# Patient Record
Sex: Female | Born: 1970 | Race: White | Hispanic: No | Marital: Married | Smoking: Never smoker
Health system: Southern US, Community
[De-identification: ages and names within clinical notes are randomized; demographics above are authoritative.]

## PROBLEM LIST (undated history)

## (undated) DIAGNOSIS — R42 Dizziness and giddiness: Secondary | ICD-10-CM

---

## 2004-07-14 ENCOUNTER — Inpatient Hospital Stay: Payer: Self-pay

## 2009-01-14 ENCOUNTER — Emergency Department: Payer: Self-pay | Admitting: Emergency Medicine

## 2009-04-06 ENCOUNTER — Other Ambulatory Visit: Payer: Self-pay

## 2009-07-15 ENCOUNTER — Ambulatory Visit: Payer: Self-pay

## 2011-08-24 ENCOUNTER — Ambulatory Visit: Payer: Self-pay | Admitting: Family Medicine

## 2012-09-26 ENCOUNTER — Ambulatory Visit: Payer: Self-pay | Admitting: Family Medicine

## 2013-10-29 ENCOUNTER — Ambulatory Visit: Payer: Self-pay | Admitting: Family Medicine

## 2014-06-04 ENCOUNTER — Encounter: Payer: Self-pay | Admitting: *Deleted

## 2014-08-05 ENCOUNTER — Ambulatory Visit (INDEPENDENT_AMBULATORY_CARE_PROVIDER_SITE_OTHER): Payer: BC Managed Care – PPO | Admitting: Surgery

## 2014-08-05 ENCOUNTER — Encounter: Payer: Self-pay | Admitting: Surgery

## 2014-08-05 VITALS — BP 127/72 | HR 74 | Temp 98.7°F | Resp 20 | Ht 66.0 in | Wt 143.0 lb

## 2014-08-05 DIAGNOSIS — R229 Localized swelling, mass and lump, unspecified: Secondary | ICD-10-CM | POA: Insufficient documentation

## 2014-08-05 NOTE — Progress Notes (Signed)
44 yo F who presents from dermatology clinic for blue tinged nodule on "perineum".  Was noticed on exam.  Min pain.  Some swelling.  No redness/drainage.  Had dysplastic lesion removed from left leg.  No fevers/chills, night sweats, shortness of breath, cough, chest pain, abdominal pain, nausea/vomiting, diarrhea/constipation.  No dysuria/hematuria.  Active Ambulatory Problems    Diagnosis Date Noted  . Skin nodule 08/05/2014   Resolved Ambulatory Problems    Diagnosis Date Noted  . No Resolved Ambulatory Problems   No Additional Past Medical History   Allergies  Allergen Reactions  . Sulfa Antibiotics Rash   History   Social History  . Marital Status: Married    Spouse Name: N/A  . Number of Children: N/A  . Years of Education: N/A   Occupational History  . Not on file.   Social History Main Topics  . Smoking status: Never Smoker   . Smokeless tobacco: Never Used  . Alcohol Use: No  . Drug Use: No  . Sexual Activity: Yes   Other Topics Concern  . Not on file   Social History Narrative  . No narrative on file   ROS: Full ROS obtained, pertinent positives and negatives as below.  Blood pressure 127/72, pulse 74, temperature 98.7 F (37.1 C), temperature source Oral, resp. rate 20, height 5\' 6"  (1.676 m), weight 64.864 kg (143 lb), last menstrual period 07/27/2014. GEN: NAD/A&Ox3 FACE: no obvious facial trauma, normal external nose, normal external ears EYES: no scleral icterus, no conjunctivitis HEAD: normocephalic atraumatic CV: RRR, no MRG RESP: moving air well, lungs clear ABD: soft, nontender, nondistended Perineum: Approx 1 x 1 cm nodule at inferior aspect of right labia, nontender, nonfluctuant, no erythema/induration, blue tinged EXT: moving all ext well, strength 5/5, right medial thigh with small ulcer with mild surrounding erythema NEURO: cnII-XII grossly intact, sensation intact all 4 ext  Imaging: none Labs: none  A?P 44 yo with right  perilabial/perineal nodule.  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 Is having appt soon with GYN for annual checkup, will defer to them and if they deem necessary I can see her again for discussion of excision.  She has been told to call me if she has increased pain, swelling, redness/drainage from lesion.  She is happy with this plan.  Right medial thigh wound I have given polysporin and nonstick gauze to facilitate healing.

## 2014-08-05 NOTE — Patient Instructions (Signed)
F/u with GYN Call if lesion becomes painful, swollen, red or begins draining

## 2015-09-23 ENCOUNTER — Encounter: Payer: Self-pay | Admitting: Emergency Medicine

## 2015-09-23 ENCOUNTER — Emergency Department
Admission: EM | Admit: 2015-09-23 | Discharge: 2015-09-24 | Disposition: A | Discharge status: Home / Self Care | Payer: BC Managed Care – PPO | Attending: Emergency Medicine | Admitting: Emergency Medicine

## 2015-09-23 DIAGNOSIS — R112 Nausea with vomiting, unspecified: Secondary | ICD-10-CM | POA: Diagnosis not present

## 2015-09-23 DIAGNOSIS — Z79899 Other long term (current) drug therapy: Secondary | ICD-10-CM | POA: Insufficient documentation

## 2015-09-23 DIAGNOSIS — R42 Dizziness and giddiness: Secondary | ICD-10-CM | POA: Diagnosis not present

## 2015-09-23 HISTORY — DX: Dizziness and giddiness: R42

## 2015-09-23 LAB — CBC
HEMATOCRIT: 42.2 % (ref 35.0–47.0)
HEMOGLOBIN: 14.3 g/dL (ref 12.0–16.0)
MCH: 30.1 pg (ref 26.0–34.0)
MCHC: 33.8 g/dL (ref 32.0–36.0)
MCV: 89 fL (ref 80.0–100.0)
Platelets: 259 10*3/uL (ref 150–440)
RBC: 4.74 MIL/uL (ref 3.80–5.20)
RDW: 13.4 % (ref 11.5–14.5)
WBC: 13.5 10*3/uL — ABNORMAL HIGH (ref 3.6–11.0)

## 2015-09-23 LAB — BASIC METABOLIC PANEL
ANION GAP: 11 (ref 5–15)
BUN: 12 mg/dL (ref 6–20)
CHLORIDE: 105 mmol/L (ref 101–111)
CO2: 23 mmol/L (ref 22–32)
Calcium: 9.4 mg/dL (ref 8.9–10.3)
Creatinine, Ser: 0.7 mg/dL (ref 0.44–1.00)
GFR calc Af Amer: >60 mL/min (ref >60)
GFR calc non Af Amer: >60 mL/min (ref >60)
GLUCOSE: 123 mg/dL — AB (ref 65–99)
POTASSIUM: 3.7 mmol/L (ref 3.5–5.1)
Sodium: 139 mmol/L (ref 135–145)

## 2015-09-23 LAB — TROPONIN I: Troponin I: <0.03 ng/mL (ref <0.03)

## 2015-09-23 MED ORDER — ONDANSETRON HCL 4 MG/2ML IJ SOLN
4.0000 mg | Freq: Once | INTRAMUSCULAR | Status: AC
  Administered 2015-09-23: 4 mg via INTRAVENOUS
  Filled 2015-09-23: qty 2

## 2015-09-23 MED ORDER — MECLIZINE HCL 25 MG PO TABS
25.0000 mg | ORAL_TABLET | Freq: Once | ORAL | Status: AC
  Administered 2015-09-23: 25 mg via ORAL
  Filled 2015-09-23: qty 1

## 2015-09-23 MED ORDER — ONDANSETRON 4 MG PO TBDP
4.0000 mg | ORAL_TABLET | Freq: Once | ORAL | Status: AC
  Administered 2015-09-23: 4 mg via ORAL

## 2015-09-23 MED ORDER — ONDANSETRON 4 MG PO TBDP
ORAL_TABLET | ORAL | Status: AC
  Filled 2015-09-23: qty 1

## 2015-09-23 MED ORDER — SODIUM CHLORIDE 0.9 % IV BOLUS (SEPSIS)
1000.0000 mL | Freq: Once | INTRAVENOUS | Status: AC
  Administered 2015-09-23: 1000 mL via INTRAVENOUS

## 2015-09-23 NOTE — ED Triage Notes (Signed)
Pt with husband via POV c/o "vertigo".  Pt reports N/V and dizziness since 1600 today and reports migraine HA last on and off since Wednesday.  Pt reports the last "vertigo" was 14 years ago when pregnant.  Pt reports taking meclizine at 2000 but has been vomiting continuously since 1800.

## 2015-09-23 NOTE — ED Provider Notes (Signed)
[facility] Emergency Department Provider Note  ____________________________________________   First MD Initiated Contact with Patient 09/23/15 2336     (approximate)  I have reviewed the triage vital signs and the nursing notes.   HISTORY  Chief Complaint Dizziness    HPI Dorothy Hudson is a 45 y.o. female presents with acute onset of dizziness at approximately 4 PM today. Patient states symptoms consistent with vertigo which she experience approximately 14 years ago. Patient states that episode initially was brief but then reoccurred at 6 PM at which time she took meclizine however this was arrived 14 years ago symptoms did not improve following this administration of meclizine. Patient admits to persistent vomiting since approximately 6 PM. Patient states symptoms are worse with movement of her head. Patient denies any headache no change in vision no weakness no numbness or change in speech  Past Medical History:  Diagnosis Date  . Vertigo     Patient Active Problem List   Diagnosis Date Noted  . Skin nodule 08/05/2014    History reviewed. No pertinent surgical history.  Prior to Admission medications   Medication Sig Start Date End Date Taking? Authorizing Provider  levothyroxine (SYNTHROID, LEVOTHROID) 100 MCG tablet TAKE 1 TABLET (100 MCG) BY ORAL ROUTE ONCE DAILY FOR 90 DAYS 07/15/14   Historical Provider, MD    Allergies Phenergan [promethazine] and Sulfa antibiotics  Family History  Problem Relation Age of Onset  . Alpha-1 antitrypsin deficiency Father   . IgA nephropathy Mother     IGA DEFICIENCY & ANTI IGA ANTIBODY    Social History Social History  Substance Use Topics  . Smoking status: Never Smoker  . Smokeless tobacco: Never Used  . Alcohol use No    Review of Systems Constitutional: No fever/chills Eyes: No visual changes. ENT: No sore throat. Cardiovascular: Denies chest pain. Respiratory: Denies shortness of  breath. Gastrointestinal: No abdominal pain.  No nausea, no vomiting.  No diarrhea.  No constipation. Genitourinary: Negative for dysuria. Musculoskeletal: Negative for back pain. Skin: Negative for rash. Neurological: Negative for headaches, focal weakness or numbness. Positive for dizziness Psychiatric:None Endocrine:None  Hematological/Lymphatic:  10-point ROS otherwise negative.  ____________________________________________   PHYSICAL EXAM:  VITAL SIGNS: ED Triage Vitals [09/23/15 2151]  Enc Vitals Group     BP 125/65     Pulse Rate 80     Resp 20     Temp 97.9 F (36.6 C)     Temp Source Oral     SpO2      Weight 153 lb (69.4 kg)     Height 5' 5.5" (1.664 m)     Head Circumference      Peak Flow      Pain Score      Pain Loc      Pain Edu?      Excl. in GC?    Constitutional: Alert and oriented. Well appearing and in no acute distress. Eyes: Conjunctivae are normal. PERRL. EOMI. Head: Atraumatic. Ears:  Healthy appearing ear canals Clear fluid noted posterior to bilateral tympanic membrane Nose: No congestion/rhinnorhea. Mouth/Throat: Mucous membranes are moist.  Oropharynx non-erythematous. Neck: No stridor.  No meningeal signs.  Cardiovascular: Normal rate, regular rhythm. Good peripheral circulation. Grossly normal heart sounds.   Respiratory: Normal respiratory effort.  No retractions. Lungs CTAB. Gastrointestinal: Soft and nontender. No distention.  Musculoskeletal: No lower extremity tenderness nor edema. No gross deformities of extremities. Neurologic:  Normal speech and language. No gross focal neurologic deficits  are appreciated.  Skin:  Skin is warm, dry and intact. No rash noted. Psychiatric: Mood and affect are normal. Speech and behavior are normal.**}  ____________________________________________   LABS (all labs ordered are listed, but only abnormal results are displayed)  Labs Reviewed  BASIC METABOLIC PANEL - Abnormal; Notable for the  following:       Result Value   Glucose, Bld 123 (*)    All other components within normal limits  CBC - Abnormal; Notable for the following:    WBC 13.5 (*)    All other components within normal limits  TROPONIN I  URINALYSIS COMPLETEWITH MICROSCOPIC ([facility] ONLY)  POC URINE PREG, ED   ____________________________________________  EKG  ED ECG REPORT I, [place] N BROWN, the attending physician, personally viewed and interpreted this ECG.   Date: 09/23/2015  EKG Time: 9:59 PM  Rate: 75  Rhythm: Normal sinus rhythm  Axis: Normal  Intervals: Normal  ST&T Change: None  ____________________________________________  RADIOLOGY  CLINICAL DATA:  Vertigo. Nausea, vomiting and dizziness. Intermittent headache. EXAM: CT HEAD WITHOUT CONTRAST TECHNIQUE: Contiguous axial images were obtained from the base of the skull through the vertex without intravenous contrast. COMPARISON:  None. FINDINGS: Brain: Gray-white differentiation is maintained. No CT evidence of acute large territory infarct. No intraparenchymal or extra-axial mass or hemorrhage. Normal size and configuration of the ventricles and basilar cisterns. No midline shift. Vascular: No hyperdense vessel or unexpected calcification. Skull: No displaced calvarial fracture. Sinuses/Orbits: Limited visualization the paranasal sinuses and mastoid air cells is normal. No air-fluid levels. Other: Regional soft tissues appear normal. IMPRESSION: Negative noncontrast head CT. Electronically Signed   By: Simonne Come M.D.   On: 09/24/2015 01:34  Procedures   ____________________________________________   INITIAL IMPRESSION / ASSESSMENT AND PLAN / ED COURSE  Pertinent labs & imaging results that were available during my care of the patient were reviewed by me and considered in my medical decision making (see chart for details).  Patient given meclizine 25 mg IV normal saline 1 L Zofran and additional 4 mg after  evaluation.Patient states symptoms much improved  Clinical Course    ____________________________________________  FINAL CLINICAL IMPRESSION(S) / ED DIAGNOSES  Final diagnoses:  Vertigo     MEDICATIONS GIVEN DURING THIS VISIT:  Medications  ondansetron (ZOFRAN-ODT) disintegrating tablet 4 mg (4 mg Oral Given 09/23/15 2213)     NEW OUTPATIENT MEDICATIONS STARTED DURING THIS VISIT:  New Prescriptions   No medications on file      Note:  This document was prepared using Dragon voice recognition software and may include unintentional dictation errors.    Darci Current, MD 09/24/15 [phone]

## 2015-09-23 NOTE — ED Notes (Signed)
Dr. Brown at bedside

## 2015-09-24 ENCOUNTER — Emergency Department: Payer: BC Managed Care – PPO

## 2015-09-24 LAB — URINALYSIS COMPLETE WITH MICROSCOPIC (ARMC ONLY)
BACTERIA UA: NONE SEEN
BILIRUBIN URINE: NEGATIVE
GLUCOSE, UA: NEGATIVE mg/dL
HGB URINE DIPSTICK: NEGATIVE
LEUKOCYTES UA: NEGATIVE
NITRITE: NEGATIVE
PH: 6 (ref 5.0–8.0)
Protein, ur: NEGATIVE mg/dL
SPECIFIC GRAVITY, URINE: 1.02 (ref 1.005–1.030)

## 2015-09-24 LAB — POCT PREGNANCY, URINE: Preg Test, Ur: NEGATIVE

## 2015-09-24 MED ORDER — MECLIZINE HCL 25 MG PO TABS
ORAL_TABLET | ORAL | Status: AC
  Filled 2015-09-24: qty 1

## 2015-09-24 MED ORDER — MECLIZINE HCL 32 MG PO TABS: 32.0000 mg | ORAL_TABLET | Freq: Three times a day (TID) | ORAL | 1 refills | Status: AC | PRN

## 2015-09-24 MED ORDER — MECLIZINE HCL 25 MG PO TABS
25.0000 mg | ORAL_TABLET | Freq: Once | ORAL | Status: AC
  Administered 2015-09-24: 25 mg via ORAL

## 2015-09-24 NOTE — ED Notes (Signed)
Pt ambulatory with assistance to toilet.

## 2015-09-24 NOTE — ED Notes (Signed)
Pt states she feels okay as long as she doesn't move. Pt states dizziness and nausea are not any more than before. Pt informed of CT results and plan to discharge.

## 2015-09-24 NOTE — ED Notes (Signed)
Pt returned from CT via stretcher.

## 2015-09-24 NOTE — ED Notes (Signed)
Pt taken to CT via stretcher.

## 2016-02-01 ENCOUNTER — Other Ambulatory Visit: Payer: Self-pay | Admitting: Internal Medicine

## 2016-02-01 DIAGNOSIS — Z1231 Encounter for screening mammogram for malignant neoplasm of breast: Secondary | ICD-10-CM

## 2016-02-11 ENCOUNTER — Ambulatory Visit
Admission: RE | Admit: 2016-02-11 | Discharge: 2016-02-11 | Disposition: A | Discharge status: Home / Self Care | Payer: BC Managed Care – PPO | Source: Ambulatory Visit | Attending: Internal Medicine | Admitting: Internal Medicine

## 2016-02-11 DIAGNOSIS — Z1231 Encounter for screening mammogram for malignant neoplasm of breast: Secondary | ICD-10-CM | POA: Insufficient documentation

## 2016-02-29 ENCOUNTER — Other Ambulatory Visit: Payer: Self-pay | Admitting: Internal Medicine

## 2016-02-29 DIAGNOSIS — R928 Other abnormal and inconclusive findings on diagnostic imaging of breast: Secondary | ICD-10-CM

## 2016-02-29 DIAGNOSIS — N6489 Other specified disorders of breast: Secondary | ICD-10-CM

## 2016-03-01 ENCOUNTER — Ambulatory Visit
Admission: RE | Admit: 2016-03-01 | Discharge: 2016-03-01 | Disposition: A | Discharge status: Home / Self Care | Payer: BC Managed Care – PPO | Source: Ambulatory Visit | Attending: Internal Medicine | Admitting: Internal Medicine

## 2016-03-01 DIAGNOSIS — N6489 Other specified disorders of breast: Secondary | ICD-10-CM

## 2016-03-01 DIAGNOSIS — R928 Other abnormal and inconclusive findings on diagnostic imaging of breast: Secondary | ICD-10-CM

## 2016-03-01 DIAGNOSIS — N651 Disproportion of reconstructed breast: Secondary | ICD-10-CM | POA: Insufficient documentation

## 2016-03-06 ENCOUNTER — Other Ambulatory Visit: Payer: BC Managed Care – PPO

## 2016-03-06 ENCOUNTER — Ambulatory Visit: Payer: BC Managed Care – PPO

## 2017-02-23 ENCOUNTER — Other Ambulatory Visit: Payer: Self-pay | Admitting: Internal Medicine

## 2017-02-23 DIAGNOSIS — Z1231 Encounter for screening mammogram for malignant neoplasm of breast: Secondary | ICD-10-CM

## 2017-03-19 ENCOUNTER — Ambulatory Visit
Admission: RE | Admit: 2017-03-19 | Discharge: 2017-03-19 | Disposition: A | Discharge status: Home / Self Care | Payer: BC Managed Care – PPO | Source: Ambulatory Visit | Attending: Internal Medicine | Admitting: Internal Medicine

## 2017-03-19 DIAGNOSIS — Z1231 Encounter for screening mammogram for malignant neoplasm of breast: Secondary | ICD-10-CM | POA: Insufficient documentation

## 2017-12-13 ENCOUNTER — Other Ambulatory Visit: Payer: Self-pay | Admitting: Otolaryngology

## 2017-12-13 DIAGNOSIS — H6981 Other specified disorders of Eustachian tube, right ear: Secondary | ICD-10-CM

## 2017-12-21 ENCOUNTER — Ambulatory Visit
Admission: RE | Admit: 2017-12-21 | Discharge: 2017-12-21 | Disposition: A | Discharge status: Home / Self Care | Payer: BC Managed Care – PPO | Source: Ambulatory Visit | Attending: Otolaryngology | Admitting: Otolaryngology

## 2017-12-21 DIAGNOSIS — H6981 Other specified disorders of Eustachian tube, right ear: Secondary | ICD-10-CM | POA: Insufficient documentation

## 2018-03-06 ENCOUNTER — Other Ambulatory Visit: Payer: Self-pay | Admitting: Obstetrics and Gynecology

## 2018-03-06 ENCOUNTER — Other Ambulatory Visit: Payer: Self-pay | Admitting: Internal Medicine

## 2018-03-07 ENCOUNTER — Ambulatory Visit (INDEPENDENT_AMBULATORY_CARE_PROVIDER_SITE_OTHER): Payer: BC Managed Care – PPO | Admitting: Obstetrics and Gynecology

## 2018-03-07 ENCOUNTER — Other Ambulatory Visit (HOSPITAL_COMMUNITY)
Admission: RE | Admit: 2018-03-07 | Discharge: 2018-03-07 | Disposition: A | Discharge status: Home / Self Care | Payer: BC Managed Care – PPO | Source: Ambulatory Visit | Attending: Obstetrics and Gynecology | Admitting: Obstetrics and Gynecology

## 2018-03-07 ENCOUNTER — Encounter: Payer: Self-pay | Admitting: Obstetrics and Gynecology

## 2018-03-07 VITALS — BP 136/81 | HR 75 | Ht 66.0 in | Wt 161.8 lb

## 2018-03-07 DIAGNOSIS — Z01419 Encounter for gynecological examination (general) (routine) without abnormal findings: Secondary | ICD-10-CM | POA: Insufficient documentation

## 2018-03-07 MED ORDER — RIZATRIPTAN BENZOATE 10 MG PO TABS: 10.0000 mg | ORAL_TABLET | ORAL | 0 refills | Status: AC | PRN

## 2018-03-07 NOTE — Progress Notes (Signed)
Subjective:   Dorothy Hudson is a 48 y.o. G2P2 Caucasian female here for a routine well-woman exam.  Patient's last menstrual period was 02/15/2018.    Current complaints: NONE, migraines lasting longer and zomig side effects can't tolerate, maxalt worked in past. PCP: Marikay Alar       doesn't desire labs  Social History: Sexual: heterosexual Marital Status: married Living situation: with family Occupation: Conservation officer, [employer] Tobacco/alcohol: no tobacco use Illicit drugs: no history of illicit drug use  The following portions of the patient's history were reviewed and updated as appropriate: allergies, current medications, past family history, past medical history, past social history, past surgical history and problem list.  Past Medical History Past Medical History:  Diagnosis Date  . Vertigo     Past Surgical History History reviewed. No pertinent surgical history.  Gynecologic History G2P2  Patient's last menstrual period was 02/15/2018. Contraception: vasectomy Last Pap: 2015?Marland Kitchen Results were: normal Last mammogram: 02/2017. Results were: normal   Obstetric History OB History  Gravida Para Term Preterm AB Living  2 2          SAB TAB Ectopic Multiple Live Births               # Outcome Date GA Lbr Len/2nd Weight Sex Delivery Anes PTL Lv  2 Para 2006     Vag-Spont     1 Para 2004     Vag-Spont       Current Medications Current Outpatient Medications on File Prior to Visit  Medication Sig Dispense Refill  . levothyroxine (SYNTHROID, LEVOTHROID) 100 MCG tablet 112 mcg.   3  . meclizine (ANTIVERT) 32 MG tablet Take 1 tablet (32 mg total) by mouth 3 (three) times daily as needed. (Patient not taking: Reported on 03/07/2018) 30 tablet 1   No current facility-administered medications on file prior to visit.     Review of Systems Patient denies any headaches, blurred vision, shortness of breath, chest pain, abdominal pain, problems with bowel movements, urination, or  intercourse.  Objective:  BP 136/81   Pulse 75   Ht 5\' 6"  (1.676 m)   Wt 161 lb 12.8 oz (73.4 kg)   LMP 02/15/2018   BMI 26.12 kg/m  Physical Exam  General:  Well developed, well nourished, no acute distress. She is alert and oriented x3. Skin:  Warm and dry Neck:  Midline trachea, no thyromegaly or nodules Cardiovascular: Regular rate and rhythm, no murmur heard Lungs:  Effort normal, all lung fields clear to auscultation bilaterally Breasts:  No dominant palpable mass, retraction, or nipple discharge Abdomen:  Soft, non tender, no hepatosplenomegaly or masses Pelvic:  External genitalia is normal in appearance.  The vagina is normal in appearance. The cervix is bulbous, no CMT.  Thin prep pap is done with HR HPV cotesting. Uterus is felt to be normal size, shape, and contour.  No adnexal masses or tenderness noted.  Extremities:  No swelling or varicosities noted Psych:  She has a normal mood and affect  Assessment:   Healthy well-woman exam migraines  Plan:  Switched to maxalt F/U 1 year for AE, or sooner if needed Mammogram ordered        Danzel Marszalek Suzan Nailer, CNM

## 2018-03-07 NOTE — Addendum Note (Signed)
Addended by: Rosine Beat L on: 03/07/2018 11:55 AM   Modules accepted: Orders

## 2018-03-11 LAB — CYTOLOGY - PAP
Diagnosis: NEGATIVE
HPV (WINDOPATH): NOT DETECTED

## 2018-04-05 ENCOUNTER — Ambulatory Visit
Admission: RE | Admit: 2018-04-05 | Discharge: 2018-04-05 | Disposition: A | Discharge status: Home / Self Care | Payer: BC Managed Care – PPO | Source: Ambulatory Visit | Attending: Obstetrics and Gynecology | Admitting: Obstetrics and Gynecology

## 2018-04-05 DIAGNOSIS — Z01419 Encounter for gynecological examination (general) (routine) without abnormal findings: Secondary | ICD-10-CM | POA: Diagnosis not present

## 2019-12-29 ENCOUNTER — Other Ambulatory Visit: Payer: Self-pay | Admitting: Internal Medicine

## 2019-12-29 DIAGNOSIS — Z1231 Encounter for screening mammogram for malignant neoplasm of breast: Secondary | ICD-10-CM

## 2020-02-03 ENCOUNTER — Ambulatory Visit
Admission: RE | Admit: 2020-02-03 | Discharge: 2020-02-03 | Disposition: A | Discharge status: Home / Self Care | Payer: BC Managed Care – PPO | Source: Ambulatory Visit | Attending: Internal Medicine | Admitting: Internal Medicine

## 2020-02-03 ENCOUNTER — Other Ambulatory Visit: Payer: Self-pay

## 2020-02-03 DIAGNOSIS — Z1231 Encounter for screening mammogram for malignant neoplasm of breast: Secondary | ICD-10-CM | POA: Diagnosis present

## 2021-01-27 ENCOUNTER — Other Ambulatory Visit: Payer: Self-pay | Admitting: Internal Medicine

## 2021-02-10 ENCOUNTER — Other Ambulatory Visit: Payer: Self-pay | Admitting: Internal Medicine

## 2021-02-10 DIAGNOSIS — Z1231 Encounter for screening mammogram for malignant neoplasm of breast: Secondary | ICD-10-CM

## 2021-02-23 ENCOUNTER — Ambulatory Visit
Admission: RE | Admit: 2021-02-23 | Discharge: 2021-02-23 | Disposition: A | Discharge status: Home / Self Care | Payer: BC Managed Care – PPO | Source: Ambulatory Visit | Attending: Internal Medicine | Admitting: Internal Medicine

## 2021-02-23 ENCOUNTER — Other Ambulatory Visit: Payer: Self-pay

## 2021-02-23 DIAGNOSIS — Z1231 Encounter for screening mammogram for malignant neoplasm of breast: Secondary | ICD-10-CM | POA: Diagnosis not present

## 2022-01-16 ENCOUNTER — Other Ambulatory Visit: Payer: Self-pay | Admitting: Internal Medicine

## 2022-01-16 DIAGNOSIS — Z1231 Encounter for screening mammogram for malignant neoplasm of breast: Secondary | ICD-10-CM

## 2022-03-01 ENCOUNTER — Ambulatory Visit
Admission: RE | Admit: 2022-03-01 | Discharge: 2022-03-01 | Disposition: A | Discharge status: Home / Self Care | Payer: BC Managed Care – PPO | Source: Ambulatory Visit | Attending: Internal Medicine | Admitting: Internal Medicine

## 2022-03-01 DIAGNOSIS — Z1231 Encounter for screening mammogram for malignant neoplasm of breast: Secondary | ICD-10-CM | POA: Insufficient documentation

## 2022-06-07 IMAGING — MG MM DIGITAL SCREENING BILAT W/ TOMO AND CAD
6 of 10 series · 6 of 30 positions shown · non-contrast
Comparison: Previous exam(s).

CLINICAL DATA: Screening.

EXAM:
DIGITAL SCREENING BILATERAL MAMMOGRAM WITH TOMOSYNTHESIS AND CAD
TECHNIQUE: Bilateral screening digital craniocaudal and mediolateral oblique
mammograms were obtained. Bilateral screening digital breast
tomosynthesis was performed. The images were evaluated with
computer-aided detection.

[L MLO synth-2D (1 of 2)]
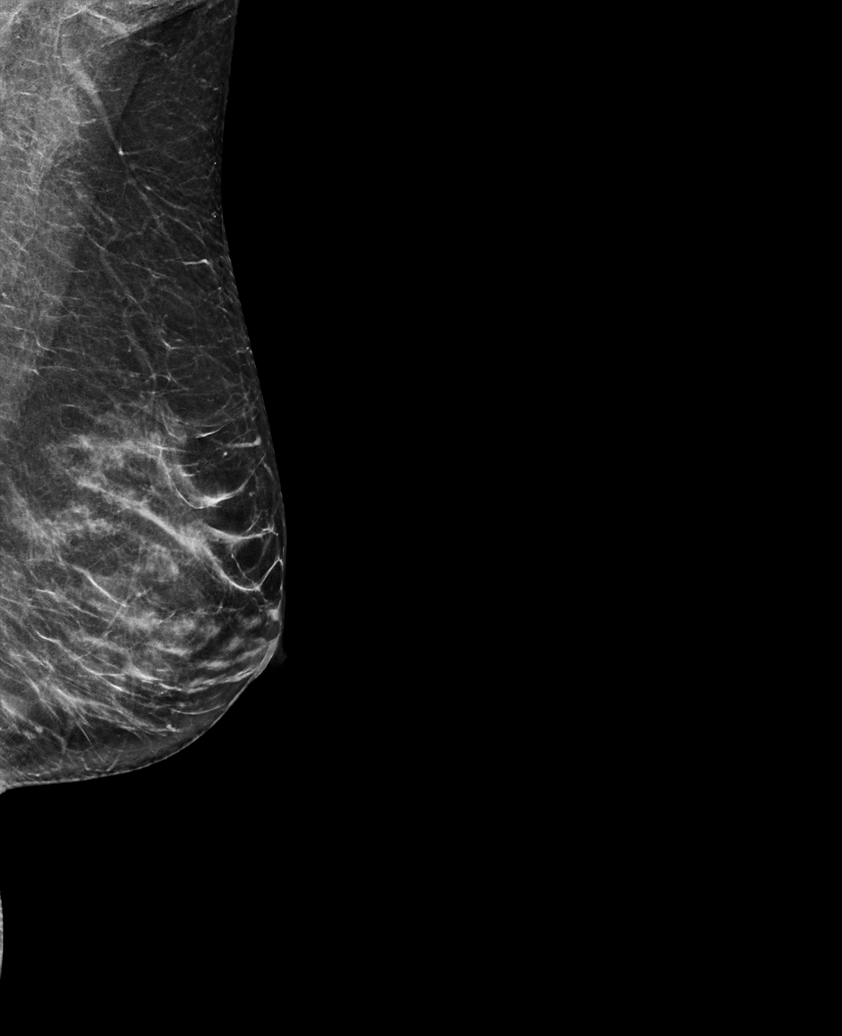

[R CC synth-2D]
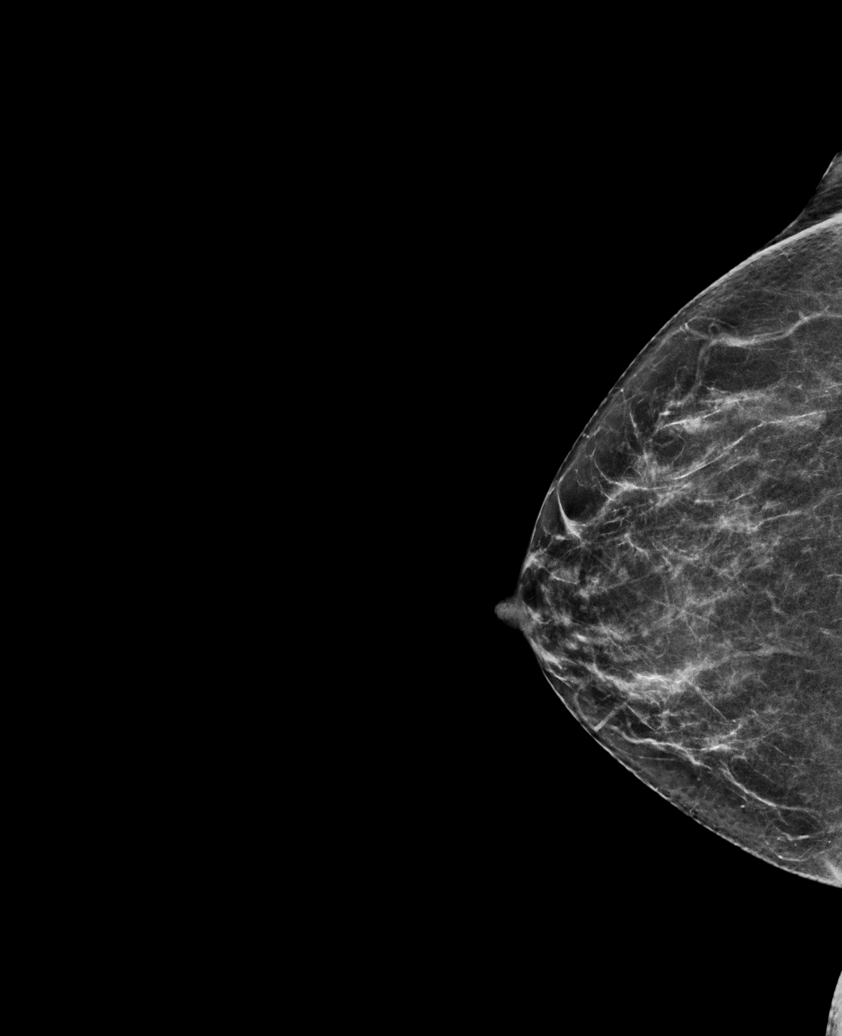

[L CC synth-2D]
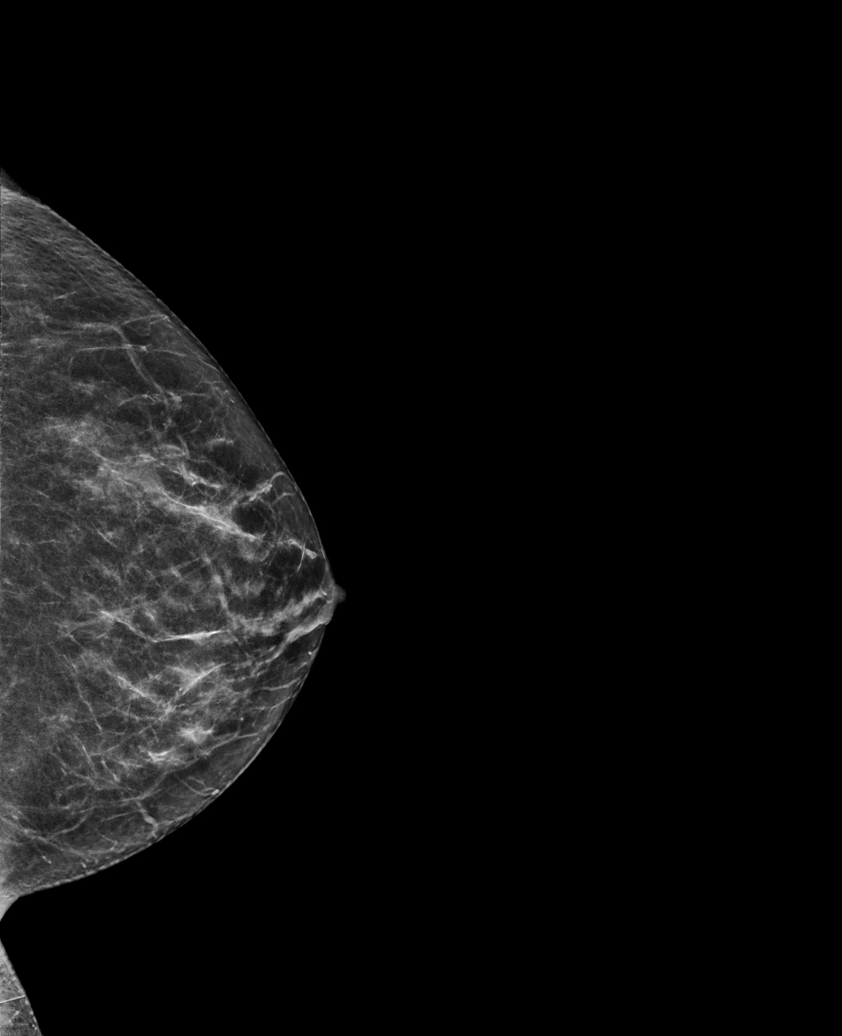

[R MLO synth-2D]
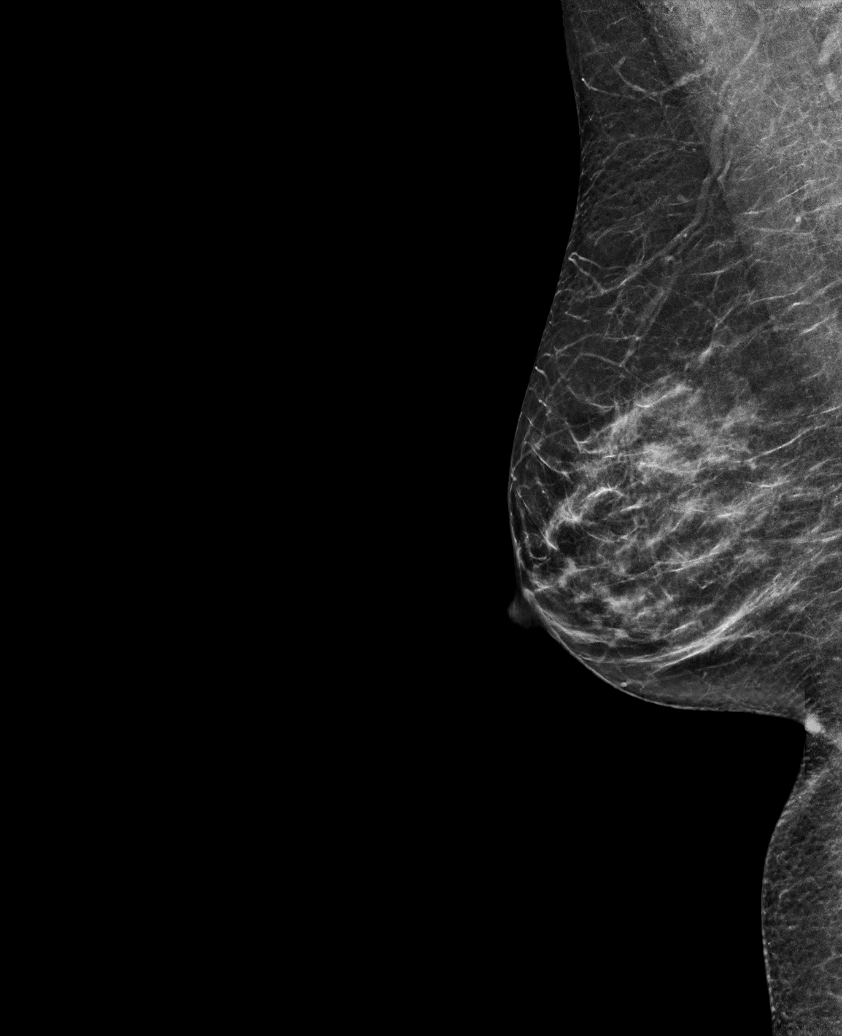

[L MLO synth-2D (2 of 2)]
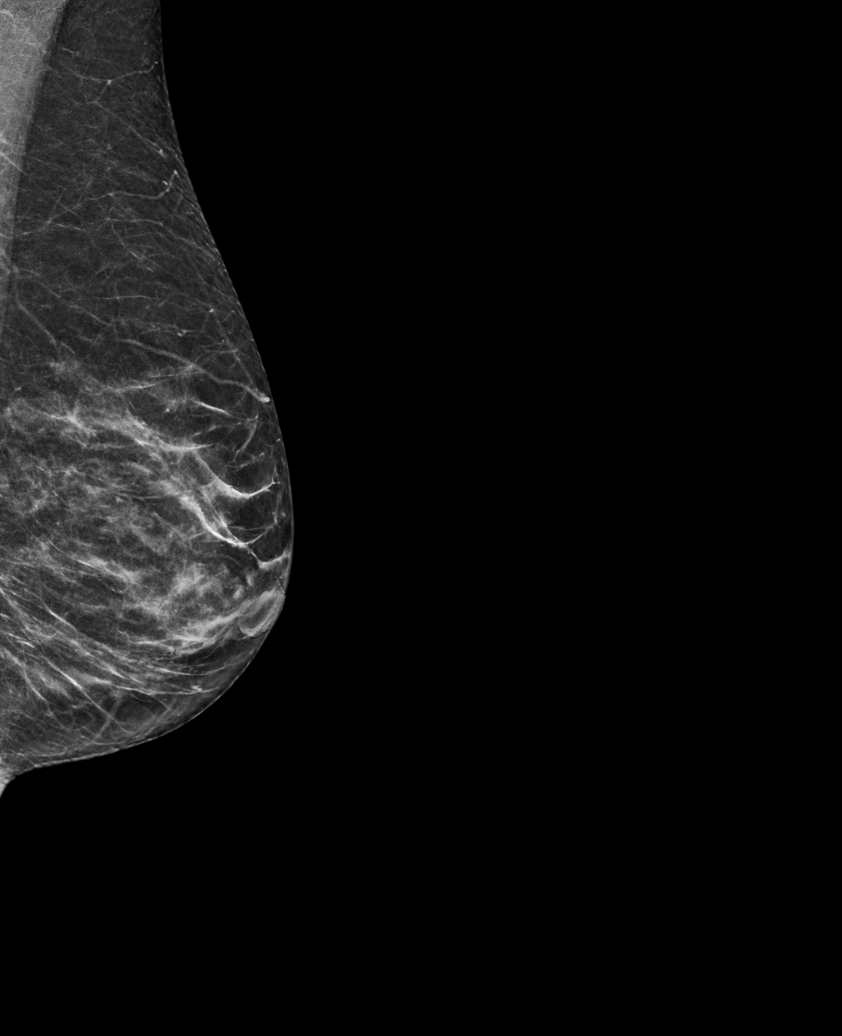

[L MLO tomo · tomo slice 31/62.0]
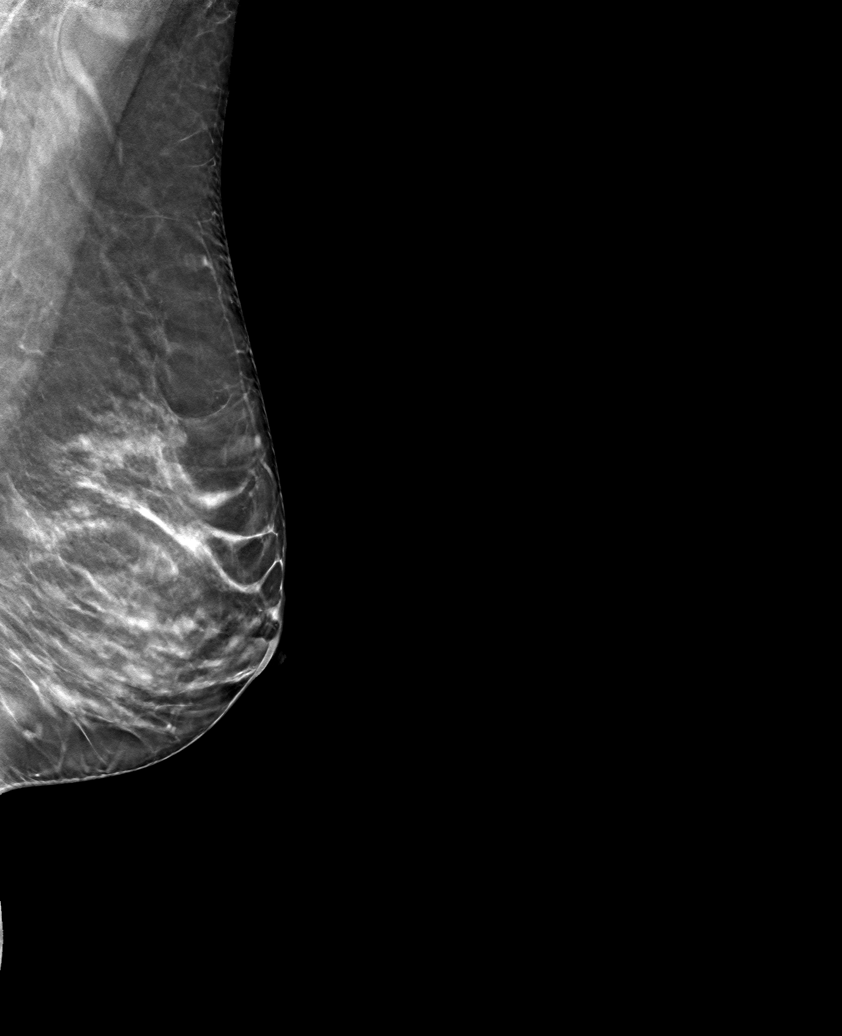

[6 of 30 positions shown; findings below may reference images not displayed]

ACR Breast Density Category c: The breast tissue is heterogeneously
dense, which may obscure small masses.
FINDINGS: There are no findings suspicious for malignancy.
IMPRESSION: No mammographic evidence of malignancy. A result letter of this
screening mammogram will be mailed directly to the patient.

RECOMMENDATION:
Screening mammogram in one year. (Code:Q3-W-BC3)

BI-RADS CATEGORY  1: Negative.

## 2023-02-05 ENCOUNTER — Other Ambulatory Visit: Payer: Self-pay | Admitting: Internal Medicine

## 2023-02-05 DIAGNOSIS — Z1231 Encounter for screening mammogram for malignant neoplasm of breast: Secondary | ICD-10-CM

## 2023-03-05 ENCOUNTER — Ambulatory Visit
Admission: RE | Admit: 2023-03-05 | Discharge: 2023-03-05 | Disposition: A | Discharge status: Home / Self Care | Payer: BC Managed Care – PPO | Source: Ambulatory Visit | Attending: Internal Medicine | Admitting: Internal Medicine

## 2023-03-05 DIAGNOSIS — Z1231 Encounter for screening mammogram for malignant neoplasm of breast: Secondary | ICD-10-CM | POA: Diagnosis present

## 2023-03-12 ENCOUNTER — Other Ambulatory Visit: Payer: Self-pay | Admitting: Cardiology

## 2023-03-12 DIAGNOSIS — R9439 Abnormal result of other cardiovascular function study: Secondary | ICD-10-CM

## 2023-03-12 DIAGNOSIS — R0789 Other chest pain: Secondary | ICD-10-CM

## 2023-03-14 ENCOUNTER — Telehealth (HOSPITAL_COMMUNITY): Payer: Self-pay | Admitting: *Deleted

## 2023-03-14 MED ORDER — METOPROLOL TARTRATE 100 MG PO TABS: ORAL_TABLET | ORAL | 0 refills | Status: AC

## 2023-03-14 NOTE — Telephone Encounter (Signed)
 Reaching out to patient to offer assistance regarding upcoming cardiac imaging study; pt verbalizes understanding of appt date/time, parking situation and where to check in, pre-test NPO status and medications ordered, and verified current allergies; name and call back number provided for further questions should they arise Johney Frame RN Navigator Cardiac Imaging [facility] [phone] office [phone] cell

## 2023-03-14 NOTE — Telephone Encounter (Signed)
 Attempted to call patient regarding upcoming cardiac CT appointment. Left message on voicemail with name and callback number Johney Frame RN Navigator Cardiac Imaging [facility] [phone] Office

## 2023-03-15 ENCOUNTER — Ambulatory Visit
Admission: RE | Admit: 2023-03-15 | Discharge: 2023-03-15 | Disposition: A | Discharge status: Home / Self Care | Payer: BC Managed Care – PPO | Source: Ambulatory Visit | Attending: Cardiology | Admitting: Cardiology

## 2023-03-15 DIAGNOSIS — R9439 Abnormal result of other cardiovascular function study: Secondary | ICD-10-CM | POA: Diagnosis present

## 2023-03-15 DIAGNOSIS — R9430 Abnormal result of cardiovascular function study, unspecified: Secondary | ICD-10-CM

## 2023-03-15 DIAGNOSIS — R0789 Other chest pain: Secondary | ICD-10-CM | POA: Diagnosis present

## 2023-03-15 MED ORDER — SODIUM CHLORIDE 0.9 % IV BOLUS
125.0000 mL | Freq: Once | INTRAVENOUS | Status: AC
Start: 2023-03-15 — End: 2023-03-15
  Administered 2023-03-15: 125 mL via INTRAVENOUS

## 2023-03-15 MED ORDER — IOHEXOL 350 MG/ML SOLN
75.0000 mL | Freq: Once | INTRAVENOUS | Status: AC | PRN
  Administered 2023-03-15: 75 mL via INTRAVENOUS

## 2023-03-15 MED ORDER — NITROGLYCERIN 0.4 MG SL SUBL
0.8000 mg | SUBLINGUAL_TABLET | Freq: Once | SUBLINGUAL | Status: AC
  Administered 2023-03-15: 0.8 mg via SUBLINGUAL

## 2023-03-15 NOTE — Progress Notes (Signed)
Patient tolerated procedure well. Ambulate w/o difficulty. Denies light headedness or being dizzy. Sitting up drinking water provided. Encouraged to drink extra water today and reasoning explained. Verbalized understanding. All questions answered. ABC intact. No further needs. Discharge from procedure area w/o issues.

## 2024-03-11 ENCOUNTER — Other Ambulatory Visit: Payer: Self-pay | Admitting: Internal Medicine

## 2024-03-11 DIAGNOSIS — Z1231 Encounter for screening mammogram for malignant neoplasm of breast: Secondary | ICD-10-CM

## 2024-04-08 ENCOUNTER — Encounter
